# Patient Record
Sex: Male | Born: 1990 | Race: Black or African American | Hispanic: No | Marital: Single | Smoking: Never smoker
Health system: Southern US, Community
[De-identification: ages and names within clinical notes are randomized; demographics above are authoritative.]

## PROBLEM LIST (undated history)

## (undated) HISTORY — PX: BACK SURGERY: SHX140

---

## 2020-01-30 ENCOUNTER — Encounter (HOSPITAL_COMMUNITY): Payer: Self-pay | Admitting: Emergency Medicine

## 2020-01-30 ENCOUNTER — Emergency Department (HOSPITAL_COMMUNITY): Payer: HRSA Program

## 2020-01-30 ENCOUNTER — Other Ambulatory Visit: Payer: Self-pay

## 2020-01-30 ENCOUNTER — Emergency Department (HOSPITAL_COMMUNITY)
Admission: EM | Admit: 2020-01-30 | Discharge: 2020-01-30 | Disposition: A | Discharge status: Home / Self Care | Payer: HRSA Program | Attending: Emergency Medicine | Admitting: Emergency Medicine

## 2020-01-30 DIAGNOSIS — Z20822 Contact with and (suspected) exposure to covid-19: Secondary | ICD-10-CM

## 2020-01-30 DIAGNOSIS — U071 COVID-19: Secondary | ICD-10-CM | POA: Diagnosis not present

## 2020-01-30 DIAGNOSIS — R197 Diarrhea, unspecified: Secondary | ICD-10-CM | POA: Diagnosis present

## 2020-01-30 LAB — SARS CORONAVIRUS 2 BY RT PCR (HOSPITAL ORDER, PERFORMED IN ~~LOC~~ HOSPITAL LAB): SARS Coronavirus 2: POSITIVE — AB

## 2020-01-30 LAB — COMPREHENSIVE METABOLIC PANEL
ALT: 18 U/L (ref 0–44)
AST: 37 U/L (ref 15–41)
Albumin: 4.5 g/dL (ref 3.5–5.0)
Alkaline Phosphatase: 53 U/L (ref 38–126)
Anion gap: 12 (ref 5–15)
BUN: 10 mg/dL (ref 6–20)
CO2: 26 mmol/L (ref 22–32)
Calcium: 10.1 mg/dL (ref 8.9–10.3)
Chloride: 101 mmol/L (ref 98–111)
Creatinine, Ser: 0.96 mg/dL (ref 0.61–1.24)
GFR calc Af Amer: >60 mL/min (ref >60)
GFR calc non Af Amer: >60 mL/min (ref >60)
Glucose, Bld: 96 mg/dL (ref 70–99)
Potassium: 3.8 mmol/L (ref 3.5–5.1)
Sodium: 139 mmol/L (ref 135–145)
Total Bilirubin: 0.7 mg/dL (ref 0.3–1.2)
Total Protein: 7.8 g/dL (ref 6.5–8.1)

## 2020-01-30 LAB — CBC
HCT: 46.4 % (ref 39.0–52.0)
Hemoglobin: 15.5 g/dL (ref 13.0–17.0)
MCH: 26.9 pg (ref 26.0–34.0)
MCHC: 33.4 g/dL (ref 30.0–36.0)
MCV: 80.4 fL (ref 80.0–100.0)
Platelets: 144 10*3/uL — ABNORMAL LOW (ref 150–400)
RBC: 5.77 MIL/uL (ref 4.22–5.81)
RDW: 14.5 % (ref 11.5–15.5)
WBC: 3.7 10*3/uL — ABNORMAL LOW (ref 4.0–10.5)
nRBC: 0 % (ref 0.0–0.2)

## 2020-01-30 LAB — URINALYSIS, ROUTINE W REFLEX MICROSCOPIC
Bacteria, UA: NONE SEEN
Bilirubin Urine: NEGATIVE
Glucose, UA: NEGATIVE mg/dL
Hgb urine dipstick: NEGATIVE
Ketones, ur: 20 mg/dL — AB
Leukocytes,Ua: NEGATIVE
Nitrite: NEGATIVE
Protein, ur: 30 mg/dL — AB
Specific Gravity, Urine: 1.028 (ref 1.005–1.030)
pH: 7 (ref 5.0–8.0)

## 2020-01-30 LAB — LIPASE, BLOOD: Lipase: 22 U/L (ref 11–51)

## 2020-01-30 MED ORDER — ALBUTEROL SULFATE HFA 108 (90 BASE) MCG/ACT IN AERS
2.0000 | INHALATION_SPRAY | Freq: Once | RESPIRATORY_TRACT | Status: AC
  Administered 2020-01-30: 2 via RESPIRATORY_TRACT
  Filled 2020-01-30: qty 6.7

## 2020-01-30 NOTE — ED Provider Notes (Addendum)
[facility]-EMERGENCY DEPT Provider Note   CSN: [id number] Arrival date & time: 01/30/20  1603     History Chief Complaint  Patient presents with  . loss of taste and smell  . Diarrhea  . Nausea  . Emesis    Nathan Harmon is a 29 y.o. male.  HPI 29 year old male presents to the ER with complaints of loss of taste or smell, diarrhea, nausea, nonbloody nonbilious vomiting, myalgias, chills x3 days.  Patient is not vaccinated.  Reports a nonbloody productive cough.  Has been feeling chilled and weak, short of breath.  Has a history of asthma per the patient and he feels like this has been flared up.  He has been having nausea, vomiting, poor p.o. intake over the last few days.  Has not taken anything for his symptoms.    History reviewed. No pertinent past medical history.  There are no problems to display for this patient.   History reviewed. No pertinent surgical history.     No family history on file.  Social History   Tobacco Use  . Smoking status: Never Smoker  . Smokeless tobacco: Never Used  Substance Use Topics  . Alcohol use: Not on file  . Drug use: Not on file    Home Medications Prior to Admission medications   Not on File    Allergies    Patient has no allergy information on record.  Review of Systems   Review of Systems  Constitutional: Positive for activity change, appetite change, chills, fatigue and fever.  HENT: Negative for ear pain and sore throat.   Eyes: Negative for pain and visual disturbance.  Respiratory: Positive for cough and shortness of breath.   Cardiovascular: Negative for chest pain and palpitations.  Gastrointestinal: Positive for diarrhea, nausea and vomiting. Negative for abdominal pain.  Genitourinary: Negative for dysuria and hematuria.  Musculoskeletal: Negative for arthralgias and back pain.  Skin: Negative for color change and rash.  Neurological: Positive for weakness. Negative for seizures and  syncope.  All other systems reviewed and are negative.   Physical Exam Updated Vital Signs BP (!) 143/98 (BP Location: Left Arm)   Pulse 78   Temp 98.8 F (37.1 C) (Oral)   Resp 16   Ht 5\' 9"  (1.753 m)   Wt 79.8 kg   SpO2 100%   BMI 25.99 kg/m   Physical Exam Vitals and nursing note reviewed.  Constitutional:      General: He is not in acute distress.    Appearance: Normal appearance. He is well-developed. He is not ill-appearing, toxic-appearing or diaphoretic.  HENT:     Head: Normocephalic and atraumatic.  Eyes:     Conjunctiva/sclera: Conjunctivae normal.  Cardiovascular:     Rate and Rhythm: Normal rate and regular rhythm.     Heart sounds: No murmur heard.   Pulmonary:     Effort: Pulmonary effort is normal. No respiratory distress.     Breath sounds: Normal breath sounds.  Abdominal:     General: Abdomen is flat.     Palpations: Abdomen is soft.     Tenderness: There is no abdominal tenderness.  Musculoskeletal:        General: Normal range of motion.     Cervical back: Normal range of motion and neck supple.  Skin:    General: Skin is warm and dry.     Findings: No erythema.  Neurological:     General: No focal deficit present.     Mental  Status: He is alert and oriented to person, place, and time.     ED Results / Procedures / Treatments   Labs (all labs ordered are listed, but only abnormal results are displayed) Labs Reviewed  CBC - Abnormal; Notable for the following components:      Result Value   WBC 3.7 (*)    Platelets 144 (*)    All other components within normal limits  URINALYSIS, ROUTINE W REFLEX MICROSCOPIC - Abnormal; Notable for the following components:   Ketones, ur 20 (*)    Protein, ur 30 (*)    All other components within normal limits  SARS CORONAVIRUS 2 BY RT PCR (HOSPITAL ORDER, PERFORMED IN [facility])  LIPASE, BLOOD  COMPREHENSIVE METABOLIC PANEL    EKG None  Radiology DG Chest Portable 1  View  Result Date: 01/30/2020 CLINICAL DATA:  Cough diarrhea EXAM: PORTABLE CHEST 1 VIEW COMPARISON:  None. FINDINGS: The heart size and mediastinal contours are within normal limits. Both lungs are clear. Surgical rods in the lower thoracic spine. IMPRESSION: No active disease. Electronically Signed   By: Jasmine Pang M.D.   On: 01/30/2020 20:58    Procedures Procedures (including critical care time)  Medications Ordered in ED Medications  albuterol (VENTOLIN HFA) 108 (90 Base) MCG/ACT inhaler 2 puff (2 puffs Inhalation Given 01/30/20 2053)    ED Course  I have reviewed the triage vital signs and the nursing notes.  Pertinent labs & imaging results that were available during my care of the patient were reviewed by me and considered in my medical decision making (see chart for details).    MDM Rules/Calculators/A&P                          29 year old male with viral URI symptoms, suspect COVID.  Vitals reassuring, not hypoxic, tachypneic, speaking full sentences, overall well-appearing.  No evidence of respiratory distress.  Covid test is pending, CBC and CMP largely unremarkable.  Chest x-ray without evidence of pneumonia.  Abdomen soft and nontender, lipase normal.  No evidence of an acute abdomen.  Patient tolerated p.o. fluids well here in the ED.  Patient was requesting a refill of his albuterol, which I provided here in the ED today.  Will send home with Zofran, return precautions discussed.  Patient educated on buying a pulse oximeter and checking for hypoxia.  Was instructed to quarantine and check his Covid test results via MyChart.  He was instructed to quarantine an additional 7 to 10 days of his test results are positive.  Stable for discharge at this time with strict return precautions.  He voiced understanding and is agreeable.  Nathan Harmon was evaluated in Emergency Department on 01/30/2020 for the symptoms described in the history of present illness. He was evaluated in the  context of the global COVID-19 pandemic, which necessitated consideration that the patient might be at risk for infection with the SARS-CoV-2 virus that causes COVID-19. Institutional protocols and algorithms that pertain to the evaluation of patients at risk for COVID-19 are in a state of rapid change based on information released by regulatory bodies including the CDC and federal and state organizations. These policies and algorithms were followed during the patient's care in the ED.  Final Clinical Impression(s) / ED Diagnoses Final diagnoses:  Encounter for laboratory testing for COVID-19 virus    Rx / DC Orders ED Discharge Orders    None  Leone Brand 01/30/20 2111    Gerhard Munch, MD 02/02/20 2256

## 2020-01-30 NOTE — ED Notes (Signed)
Blue and gold top (2) sent to lab. 

## 2020-01-30 NOTE — ED Triage Notes (Signed)
Patient here from home reporting loss of taste and smell, diarrhea, n/v since Monday. Non vaccinated.

## 2020-01-30 NOTE — Discharge Instructions (Addendum)
You were tested for COVID-19 today, make sure to quarantine until you receive the results. If this is positive, please make sure to quarantine additional 7 to 10 days. You may take ibuprofen/Tylenol for body aches and fevers, drink plenty of fluids, over-the-counter cold and flu medications. You may also buy an over-the-counter pulse oximeter which can be found at your local pharmacy. If your oxygen saturation drops below 90%, please make sure to return to the ER. Return to the ER for any worsening shortness of breath.      Person Under Monitoring Name: Nathan Harmon  Location: [address] [place] [address]   Infection Prevention Recommendations for Individuals Confirmed to have, or Being Evaluated for, 2019 Novel Coronavirus (COVID-19) Infection Who Receive Care at Home  Individuals who are confirmed to have, or are being evaluated for, COVID-19 should follow the prevention steps below until a healthcare provider or local or state health department says they can return to normal activities.  Stay home except to get medical care You should restrict activities outside your home, except for getting medical care. Do not go to work, school, or public areas, and do not use public transportation or taxis.  Call ahead before visiting your doctor Before your medical appointment, call the healthcare provider and tell them that you have, or are being evaluated for, COVID-19 infection. This will help the healthcare provider's office take steps to keep other people from getting infected. Ask your healthcare provider to call the local or state health department.  Monitor your symptoms Seek prompt medical attention if your illness is worsening (e.g., difficulty breathing). Before going to your medical appointment, call the healthcare provider and tell them that you have, or are being evaluated for, COVID-19 infection. Ask your healthcare provider to call the local or state health  department.  Wear a facemask You should wear a facemask that covers your nose and mouth when you are in the same room with other people and when you visit a healthcare provider. People who live with or visit you should also wear a facemask while they are in the same room with you.  Separate yourself from other people in your home As much as possible, you should stay in a different room from other people in your home. Also, you should use a separate bathroom, if available.  Avoid sharing household items You should not share dishes, drinking glasses, cups, eating utensils, towels, bedding, or other items with other people in your home. After using these items, you should wash them thoroughly with soap and water.  Cover your coughs and sneezes Cover your mouth and nose with a tissue when you cough or sneeze, or you can cough or sneeze into your sleeve. Throw used tissues in a lined trash can, and immediately wash your hands with soap and water for at least 20 seconds or use an alcohol-based hand rub.  Wash your [employer] your hands often and thoroughly with soap and water for at least 20 seconds. You can use an alcohol-based hand sanitizer if soap and water are not available and if your hands are not visibly dirty. Avoid touching your eyes, nose, and mouth with unwashed hands.   Prevention Steps for Caregivers and Household Members of Individuals Confirmed to have, or Being Evaluated for, COVID-19 Infection Being Cared for in the Home  If you live with, or provide care at home for, a person confirmed to have, or being evaluated for, COVID-19 infection please follow these guidelines  to prevent infection:  Follow healthcare provider's instructions Make sure that you understand and can help the patient follow any healthcare provider instructions for all care.  Provide for the patient's basic needs You should help the patient with basic needs in the home and provide support for getting  groceries, prescriptions, and other personal needs.  Monitor the patient's symptoms If they are getting sicker, call his or her medical provider and tell them that the patient has, or is being evaluated for, COVID-19 infection. This will help the healthcare provider's office take steps to keep other people from getting infected. Ask the healthcare provider to call the local or state health department.  Limit the number of people who have contact with the patient If possible, have only one caregiver for the patient. Other household members should stay in another home or place of residence. If this is not possible, they should stay in another room, or be separated from the patient as much as possible. Use a separate bathroom, if available. Restrict visitors who do not have an essential need to be in the home.  Keep older adults, very young children, and other sick people away from the patient Keep older adults, very young children, and those who have compromised immune systems or chronic health conditions away from the patient. This includes people with chronic heart, lung, or kidney conditions, diabetes, and cancer.  Ensure good ventilation Make sure that shared spaces in the home have good air flow, such as from an air conditioner or an opened window, weather permitting.  Wash your hands often Wash your hands often and thoroughly with soap and water for at least 20 seconds. You can use an alcohol based hand sanitizer if soap and water are not available and if your hands are not visibly dirty. Avoid touching your eyes, nose, and mouth with unwashed hands. Use disposable paper towels to dry your hands. If not available, use dedicated cloth towels and replace them when they become wet.  Wear a facemask and gloves Wear a disposable facemask at all times in the room and gloves when you touch or have contact with the patient's blood, body fluids, and/or secretions or excretions, such as sweat,  saliva, sputum, nasal mucus, vomit, urine, or feces.  Ensure the mask fits over your nose and mouth tightly, and do not touch it during use. Throw out disposable facemasks and gloves after using them. Do not reuse. Wash your hands immediately after removing your facemask and gloves. If your personal clothing becomes contaminated, carefully remove clothing and launder. Wash your hands after handling contaminated clothing. Place all used disposable facemasks, gloves, and other waste in a lined container before disposing them with other household waste. Remove gloves and wash your hands immediately after handling these items.  Do not share dishes, glasses, or other household items with the patient Avoid sharing household items. You should not share dishes, drinking glasses, cups, eating utensils, towels, bedding, or other items with a patient who is confirmed to have, or being evaluated for, COVID-19 infection. After the person uses these items, you should wash them thoroughly with soap and water.  Wash laundry thoroughly Immediately remove and wash clothes or bedding that have blood, body fluids, and/or secretions or excretions, such as sweat, saliva, sputum, nasal mucus, vomit, urine, or feces, on them. Wear gloves when handling laundry from the patient. Read and follow directions on labels of laundry or clothing items and detergent. In general, wash and dry with the warmest temperatures recommended  on the label.  Clean all areas the individual has used often Clean all touchable surfaces, such as counters, tabletops, doorknobs, bathroom fixtures, toilets, phones, keyboards, tablets, and bedside tables, every day. Also, clean any surfaces that may have blood, body fluids, and/or secretions or excretions on them. Wear gloves when cleaning surfaces the patient has come in contact with. Use a diluted bleach solution (e.g., dilute bleach with 1 part bleach and 10 parts water) or a household disinfectant  with a label that says EPA-registered for coronaviruses. To make a bleach solution at home, add 1 tablespoon of bleach to 1 quart (4 cups) of water. For a larger supply, add  cup of bleach to 1 gallon (16 cups) of water. Read labels of cleaning products and follow recommendations provided on product labels. Labels contain instructions for safe and effective use of the cleaning product including precautions you should take when applying the product, such as wearing gloves or eye protection and making sure you have good ventilation during use of the product. Remove gloves and wash hands immediately after cleaning.  Monitor yourself for signs and symptoms of illness Caregivers and household members are considered close contacts, should monitor their health, and will be asked to limit movement outside of the home to the extent possible. Follow the monitoring steps for close contacts listed on the symptom monitoring form.   ? If you have additional questions, contact your local health department or call the epidemiologist on call at [phone] (available 24/7). ? This guidance is subject to change. For the most up-to-date guidance from [facility], please refer to their website: [employer]

## 2020-02-01 ENCOUNTER — Encounter: Payer: Self-pay | Admitting: Nurse Practitioner

## 2020-02-01 ENCOUNTER — Telehealth: Payer: Self-pay | Admitting: Nurse Practitioner

## 2020-02-01 DIAGNOSIS — U071 COVID-19: Secondary | ICD-10-CM

## 2020-02-01 NOTE — Telephone Encounter (Signed)
Called to Discuss with patient about Covid symptoms and the use of regeneron, a monoclonal antibody infusion for those with mild to moderate Covid symptoms and at a high risk of hospitalization.     Pt is qualified for this infusion at the [facility] due to co-morbid conditions and/or a member of an at-risk group.     Unable to reach pt. Left message to return call. Sent mychart message.   Prudy Candy, DNP, AGNP-C [phone] (Infusion Center Hotline)  

## 2021-02-17 IMAGING — DX DG CHEST 1V PORT
1 series · 1 of 1 positions shown · non-contrast
Comparison: None.

CLINICAL DATA: Cough diarrhea

EXAM:
PORTABLE CHEST 1 VIEW

[chest ap]
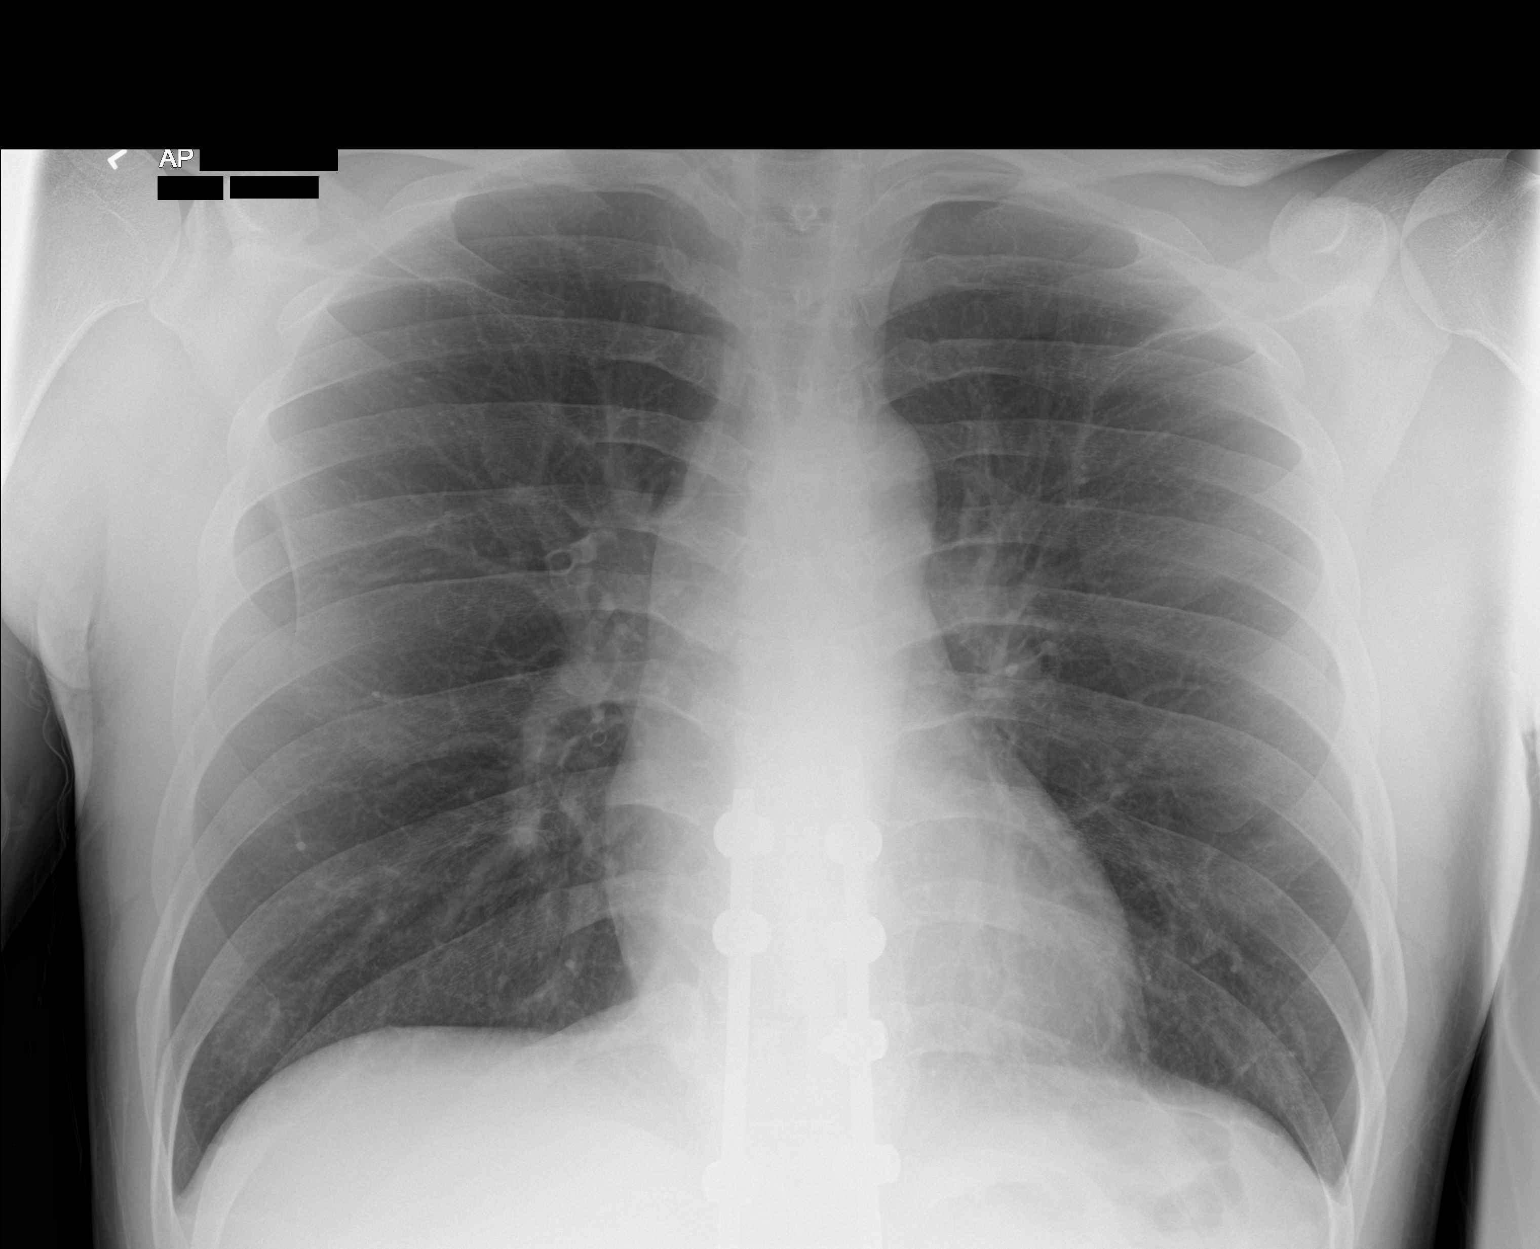

[1 of 1 positions shown; findings below may reference images not displayed]

FINDINGS: The heart size and mediastinal contours are within normal limits.
Both lungs are clear. Surgical rods in the lower thoracic spine.
IMPRESSION: No active disease.

## 2021-06-18 ENCOUNTER — Other Ambulatory Visit: Payer: Self-pay

## 2021-06-18 ENCOUNTER — Emergency Department (HOSPITAL_COMMUNITY)
Admission: EM | Admit: 2021-06-18 | Discharge: 2021-06-18 | Disposition: A | Discharge status: Home / Self Care | Payer: Self-pay | Attending: Emergency Medicine | Admitting: Emergency Medicine

## 2021-06-18 ENCOUNTER — Encounter (HOSPITAL_COMMUNITY): Payer: Self-pay

## 2021-06-18 DIAGNOSIS — S61512A Laceration without foreign body of left wrist, initial encounter: Secondary | ICD-10-CM | POA: Insufficient documentation

## 2021-06-18 DIAGNOSIS — Z23 Encounter for immunization: Secondary | ICD-10-CM | POA: Insufficient documentation

## 2021-06-18 DIAGNOSIS — W25XXXA Contact with sharp glass, initial encounter: Secondary | ICD-10-CM | POA: Insufficient documentation

## 2021-06-18 MED ORDER — BACITRACIN ZINC 500 UNIT/GM EX OINT
TOPICAL_OINTMENT | Freq: Two times a day (BID) | CUTANEOUS | Status: DC
  Filled 2021-06-18: qty 1.8
  Filled 2021-06-18: qty 0.9

## 2021-06-18 MED ORDER — TETANUS-DIPHTH-ACELL PERTUSSIS 5-2.5-18.5 LF-MCG/0.5 IM SUSY
0.5000 mL | PREFILLED_SYRINGE | Freq: Once | INTRAMUSCULAR | Status: AC
  Administered 2021-06-18: 0.5 mL via INTRAMUSCULAR
  Filled 2021-06-18: qty 0.5

## 2021-06-18 MED ORDER — CEPHALEXIN 500 MG PO CAPS: 500.0000 mg | ORAL_CAPSULE | Freq: Two times a day (BID) | ORAL | 0 refills | Status: AC

## 2021-06-18 MED ORDER — LIDOCAINE-EPINEPHRINE (PF) 2 %-1:200000 IJ SOLN
10.0000 mL | Freq: Once | INTRAMUSCULAR | Status: AC
  Administered 2021-06-18: 10 mL via INTRADERMAL
  Filled 2021-06-18: qty 20
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
## 2021-06-18 NOTE — Discharge Instructions (Addendum)
1. Medications: Tylenol or ibuprofen for pain, usual home medications 2. Treatment: ice for swelling, keep wound clean with warm soap and water and keep bandage dry, do not submerge in water for 24 hours 3. Follow Up: Please return or follow up with primary care provider in 7-10 days to have your stitches/staples removed or sooner if you have concerns. Return to the emergency department for increased redness, drainage of pus from the wound, or fevers/chills. Once you have your stitches removed, you can use a cream called Mederma to help with scarring.   WOUND CARE  Keep area clean and dry for 24 hours. Do not remove bandage, if applied.  After 24 hours, remove bandage and wash wound gently with mild soap and warm water. Reapply a new bandage after cleaning wound, if directed.   Continue daily cleansing with soap and water until stitches/staples are removed.  Do not apply any ointments or creams to the wound while stitches/staples are in place, as this may cause delayed healing. Return if you experience any of the following signs of infection: Swelling, redness, pus drainage, streaking, fever >101.0 F  Return if you experience excessive bleeding that does not stop after 15-20 minutes of constant, firm pressure.
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
## 2021-06-26 ENCOUNTER — Emergency Department (HOSPITAL_COMMUNITY)
Admission: EM | Admit: 2021-06-26 | Discharge: 2021-06-26 | Disposition: A | Discharge status: Home / Self Care | Payer: Self-pay | Attending: Emergency Medicine | Admitting: Emergency Medicine

## 2021-06-26 ENCOUNTER — Other Ambulatory Visit: Payer: Self-pay

## 2021-06-26 ENCOUNTER — Encounter (HOSPITAL_COMMUNITY): Payer: Self-pay

## 2021-06-26 DIAGNOSIS — Z4802 Encounter for removal of sutures: Secondary | ICD-10-CM | POA: Insufficient documentation

## 2021-06-26 DIAGNOSIS — X58XXXD Exposure to other specified factors, subsequent encounter: Secondary | ICD-10-CM | POA: Insufficient documentation

## 2021-06-26 DIAGNOSIS — S61502D Unspecified open wound of left wrist, subsequent encounter: Secondary | ICD-10-CM | POA: Insufficient documentation

## 2021-06-26 NOTE — Discharge Instructions (Signed)
It was a pleasure taking care of you today. Keep area clean.

## 2021-06-26 NOTE — ED Triage Notes (Signed)
Pt is coming to have sutures removed from last Monday to his left wrist.

## 2021-06-26 NOTE — ED Provider Notes (Signed)
[facility]   CSN: [id number] Arrival date & time: 06/26/21  1801     History  Chief Complaint  Patient presents with   Suture / Staple Removal    Nathan Harmon is a 31 y.o. male who presents to the ED for suture removal.  Patient had 6 stitches placed on 1/30 to his left wrist.  Denies purulent drainage.  No fever or chills.  He notes he has been cleaning the wound with soap and water.     Home Medications Prior to Admission medications   Not on File      Allergies    Patient has no allergy information on record.    Review of Systems   Review of Systems  Constitutional:  Negative for fever.  Skin:  Positive for wound.   Physical Exam Updated Vital Signs BP (!) 154/101 (BP Location: Right Arm)    Pulse 73    Temp 98.4 F (36.9 C) (Oral)    Resp 18    Ht 5\' 9"  (1.753 m)    Wt 81.6 kg    SpO2 100%    BMI 26.58 kg/m  Physical Exam Vitals and nursing note reviewed.  Constitutional:      General: He is not in acute distress.    Appearance: He is not ill-appearing.  HENT:     Head: Normocephalic.  Eyes:     Pupils: Pupils are equal, round, and reactive to light.  Cardiovascular:     Rate and Rhythm: Normal rate and regular rhythm.     Pulses: Normal pulses.     Heart sounds: Normal heart sounds. No murmur heard.   No friction rub. No gallop.  Pulmonary:     Effort: Pulmonary effort is normal.     Breath sounds: Normal breath sounds.  Abdominal:     General: Abdomen is flat. There is no distension.     Palpations: Abdomen is soft.     Tenderness: There is no abdominal tenderness. There is no guarding or rebound.  Musculoskeletal:        General: Normal range of motion.     Cervical back: Neck supple.  Skin:    General: Skin is warm and dry.     Comments: Well healing wound to left wrist. No purulent drainage  Neurological:     General: No focal deficit present.     Mental Status: He is alert.  Psychiatric:         Mood and Affect: Mood normal.        Behavior: Behavior normal.    ED Results / Procedures / Treatments   Labs (all labs ordered are listed, but only abnormal results are displayed) Labs Reviewed - No data to display  EKG None  Radiology No results found.  Procedures .Suture Removal  Date/Time: 06/26/2021 8:38 PM Performed by: 08/24/2021, PA-C Authorized by: Mannie Stabile, PA-C   Consent:    Consent obtained:  Verbal   Consent given by:  Patient   Risks discussed:  Bleeding, pain and wound separation   Alternatives discussed:  No treatment Universal protocol:    Procedure explained and questions answered to patient or proxy's satisfaction: yes     Relevant documents present and verified: yes     Test results available: yes     Imaging studies available: yes     Required blood products, implants, devices, and special equipment available: yes     Site/side marked: yes  Immediately prior to procedure, a time out was called: yes     Patient identity confirmed:  Verbally with patient Location:    Location:  Upper extremity   Upper extremity location:  Arm   Arm location:  L lower arm Procedure details:    Wound appearance:  No signs of infection   Number of sutures removed:  6 Post-procedure details:    Post-removal:  No dressing applied   Procedure completion:  Tolerated well, no immediate complications    Medications Ordered in ED Medications - No data to display  ED Course/ Medical Decision Making/ A&P                           Medical Decision Making Risk OTC drugs.   31 year old male presents the ED for suture removal.  Patient had 6 sutures placed on 1/30 to left wrist.  No fever or chills.  No purulent drainage.  Well healing wound to left wrist.  Suture removal as noted above.  No signs of infection. Strict ED precautions discussed with patient. Patient states understanding and agrees to plan. Patient discharged home in no acute  distress and stable vitals        Final Clinical Impression(s) / ED Diagnoses Final diagnoses:  Visit for suture removal    Rx / DC Orders ED Discharge Orders     None         Jesusita Oka 06/26/21 2041    Linwood Dibbles, MD 06/27/21 0002

## 2021-10-07 ENCOUNTER — Emergency Department (HOSPITAL_COMMUNITY): Payer: Self-pay

## 2021-10-07 ENCOUNTER — Other Ambulatory Visit: Payer: Self-pay

## 2021-10-07 ENCOUNTER — Emergency Department (HOSPITAL_COMMUNITY)
Admission: EM | Admit: 2021-10-07 | Discharge: 2021-10-07 | Disposition: A | Discharge status: Home / Self Care | Payer: Self-pay | Attending: Emergency Medicine | Admitting: Emergency Medicine

## 2021-10-07 ENCOUNTER — Encounter (HOSPITAL_COMMUNITY): Payer: Self-pay

## 2021-10-07 DIAGNOSIS — R001 Bradycardia, unspecified: Secondary | ICD-10-CM | POA: Insufficient documentation

## 2021-10-07 DIAGNOSIS — R0789 Other chest pain: Secondary | ICD-10-CM | POA: Insufficient documentation

## 2021-10-07 DIAGNOSIS — R42 Dizziness and giddiness: Secondary | ICD-10-CM | POA: Insufficient documentation

## 2021-10-07 DIAGNOSIS — R0981 Nasal congestion: Secondary | ICD-10-CM | POA: Insufficient documentation

## 2021-10-07 DIAGNOSIS — R0989 Other specified symptoms and signs involving the circulatory and respiratory systems: Secondary | ICD-10-CM

## 2021-10-07 LAB — CBC
HCT: 43.7 % (ref 39.0–52.0)
Hemoglobin: 14.4 g/dL (ref 13.0–17.0)
MCH: 27 pg (ref 26.0–34.0)
MCHC: 33 g/dL (ref 30.0–36.0)
MCV: 81.8 fL (ref 80.0–100.0)
Platelets: 165 10*3/uL (ref 150–400)
RBC: 5.34 MIL/uL (ref 4.22–5.81)
RDW: 15.8 % — ABNORMAL HIGH (ref 11.5–15.5)
WBC: 4.4 10*3/uL (ref 4.0–10.5)
nRBC: 0 % (ref 0.0–0.2)

## 2021-10-07 LAB — BASIC METABOLIC PANEL
Anion gap: 6 (ref 5–15)
BUN: 11 mg/dL (ref 6–20)
CO2: 27 mmol/L (ref 22–32)
Calcium: 9.9 mg/dL (ref 8.9–10.3)
Chloride: 106 mmol/L (ref 98–111)
Creatinine, Ser: 0.93 mg/dL (ref 0.61–1.24)
GFR, Estimated: >60 mL/min (ref >60)
Glucose, Bld: 82 mg/dL (ref 70–99)
Potassium: 4.1 mmol/L (ref 3.5–5.1)
Sodium: 139 mmol/L (ref 135–145)

## 2021-10-07 LAB — TROPONIN I (HIGH SENSITIVITY): Troponin I (High Sensitivity): 3 ng/L (ref <18)

## 2021-10-07 NOTE — Discharge Instructions (Signed)
Please find a primary care provider for further evaluation and management of your condition.  Your symptoms may be due to irritation from the fumes at your work environment.  Please avoid direct inhalation of smokes/fumes when possible.

## 2021-10-07 NOTE — ED Provider Notes (Signed)
[facility]PT Provider Note   CSN: [id number] Arrival date & time: 10/07/21  1007     History  Chief Complaint  Patient presents with   Sore Throat    Nathan Harmon is a 31 y.o. male.  The history is provided by the patient. No language interpreter was used.  Sore Throat   31 year old male presenting complaining of throat irritation.  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  His symptoms  starts after he work for period of time and persist throughout the day.  States he would experience nasal congestion having to blow his nose frequently, throat irritation, and chest congestion.  He feels more tired than usual.  He does not endorse any fever, chills, body aches, itchy eyes, excessive sneezing, abdominal pain or rash.  He does use marijuana.  He denies heavy alcohol use.  He denies any specific treatment tried.  No recent sick contact.  Home Medications Prior to Admission medications   Not on File      Allergies    Patient has no known allergies.    Review of Systems   Review of Systems  All other systems reviewed and are negative.  Physical Exam Updated Vital Signs BP (!) 121/92 (BP Location: Right Arm)   Pulse (!) 49   Temp 98.2 F (36.8 C) (Oral)   Resp 14   Ht 5\' 10"  (1.778 m)   Wt 83.9 kg   SpO2 99%   BMI 26.54 kg/m  Physical Exam Vitals and nursing note reviewed.  Constitutional:      General: He is not in acute distress.    Appearance: He is well-developed.  HENT:     Head: Atraumatic.     Nose: Nose normal.     Mouth/Throat:     Mouth: Mucous membranes are moist.     Pharynx: No oropharyngeal exudate or posterior oropharyngeal erythema.  Eyes:     Conjunctiva/sclera: Conjunctivae normal.  Neck:     Comments: No thyromegaly Cardiovascular:     Rate and Rhythm: Bradycardia present.      Pulses: Normal pulses.     Heart sounds: Normal heart sounds.  Pulmonary:     Effort: Pulmonary effort is normal.     Breath sounds: Normal breath sounds. No wheezing, rhonchi or rales.  Abdominal:     Palpations: Abdomen is soft.     Tenderness: There is no abdominal tenderness.  Musculoskeletal:     Cervical back: Normal range of motion and neck supple.  Skin:    Findings: No rash.  Neurological:     Mental Status: He is alert and oriented to person, place, and time.  Psychiatric:        Mood and Affect: Mood normal.    ED Results / Procedures / Treatments   Labs (all labs ordered are listed, but only abnormal results are displayed) Labs Reviewed  CBC - Abnormal; Notable for the following components:      Result Value   RDW 15.8 (*)    All other components within normal limits  BASIC METABOLIC PANEL  TROPONIN I (HIGH SENSITIVITY)  TROPONIN I (HIGH SENSITIVITY)    EKG EKG Interpretation  Date/Time:  Sunday Oct 07 2021 10:22:31 EDT Ventricular Rate:  55 PR Interval:  172 QRS Duration: 86 QT Interval:  401 QTC Calculation: 384 R Axis:   95 Text  Interpretation: Sinus rhythm Borderline right axis deviation ST elev, probable normal early repol pattern Confirmed by Marianna Fuss ([id number]) on 10/07/2021 10:44:18 AM  Radiology DG Chest 2 View  Result Date: 10/07/2021 CLINICAL DATA:  31 year old male with cough, chest pain, sore throat. EXAM: CHEST - 2 VIEW COMPARISON:  Portable chest 01/30/2020. FINDINGS: Chronic posterior thoracolumbar spinal fusion hardware. No acute osseous abnormality identified. Normal lung volumes and mediastinal contours. Visualized tracheal air column is within normal limits. Both lungs appear clear. No pneumothorax or pleural effusion. Negative visible bowel gas. IMPRESSION: No cardiopulmonary abnormality.  Previous thoracolumbar fusion. Electronically Signed   By: Odessa Fleming M.D.   On: 10/07/2021 12:19    Procedures Procedures    Medications  Ordered in ED Medications - No data to display  ED Course/ Medical Decision Making/ A&P                           Medical Decision Making Amount and/or Complexity of Data Reviewed Labs: ordered. Radiology: ordered.   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  He endorsed occasional pain in his chest but denies any lightheadedness or dizziness.  He does not endorse any fever chills body aches to suggest infectious etiology.  No significant itchy eyes or sneezing to suggest seasonal allergy.  Patient overall well-appearing vital signs noted to have bradycardia with heart rates fluctuate between 40s to 50s.  Heart and lung sounds normal, throat exam unremarkable, patient speaking in complete sentences and resting comfortably.  Labs obtained and independently reviewed interpreted by me and are reassuring.  EKG obtained showing sinus bradycardia without concerning heart block.    At this time, no acute concerning pathology noted warranting hospital admission.  I felt patient is stable to be discharged home to follow-up closely with PCP for outpatient evaluation.  I will also provide work note.  Encourage patient to avoid environment that can potentially triggers his shortness of breath such as tight enclose place with presence of smoke.  This patient presents to the ED for concern of cough, this involves an extensive number of treatment options, and is a complaint that carries with it a high risk of complications and morbidity.  The differential diagnosis includes reactive airway disease due to fume  inhalation, pna, ptx, copd, asthma  Co morbidities that complicate the patient evaluation  Additional history obtained:  Additional history obtained from patient External records from outside source obtained and reviewed including none  Lab Tests:  I Ordered, and personally interpreted labs.  The pertinent results include:  as above  Imaging Studies ordered:  I ordered imaging studies including CXR I independently visualized and interpreted imaging which showed no acute finding I agree with the radiologist interpretation  Cardiac Monitoring:  The patient was maintained on a cardiac monitor.  I personally viewed and interpreted the cardiac monitored which showed an underlying rhythm of: sinus bradycardia  Medicines ordered and prescription drug management:  Dispostion:  After consideration of the diagnostic results and the patients response to treatment, I feel that the patent would benefit from outpt f/u with PCP.          Final Clinical Impression(s) / ED Diagnoses Final diagnoses:  Chest congestion    Rx / DC Orders ED Discharge Orders     None         Fayrene Helperran, Edahi Kroening, PA-C 10/07/21 1401    Milagros Lollykstra, Richard S, MD 10/07/21 [id number]

## 2021-10-07 NOTE — ED Triage Notes (Signed)
Patient rports that he feels like he has swelling his throat x 2 weeks. Patient is talking in complete sentences and Sats 100% on room air, When asked about his c/o chest pain, patient states, "I work at [employer] and it is humid and hot in there and it causes me to not be able to take a full breath. Patient denies a cough.

## 2021-10-16 ENCOUNTER — Encounter (HOSPITAL_COMMUNITY): Payer: Self-pay

## 2021-10-16 ENCOUNTER — Emergency Department (HOSPITAL_COMMUNITY): Payer: Self-pay

## 2021-10-16 ENCOUNTER — Emergency Department (HOSPITAL_COMMUNITY)
Admission: EM | Admit: 2021-10-16 | Discharge: 2021-10-16 | Disposition: A | Discharge status: Home / Self Care | Payer: Self-pay | Attending: Emergency Medicine | Admitting: Emergency Medicine

## 2021-10-16 ENCOUNTER — Other Ambulatory Visit: Payer: Self-pay

## 2021-10-16 DIAGNOSIS — J40 Bronchitis, not specified as acute or chronic: Secondary | ICD-10-CM | POA: Insufficient documentation

## 2021-10-16 MED ORDER — AZITHROMYCIN 250 MG PO TABS: 250.0000 mg | ORAL_TABLET | Freq: Every day | ORAL | 0 refills | Status: DC

## 2021-10-16 MED ORDER — DEXAMETHASONE 4 MG PO TABS
10.0000 mg | ORAL_TABLET | Freq: Once | ORAL | Status: AC
  Administered 2021-10-16: 10 mg via ORAL
  Filled 2021-10-16: qty 1

## 2021-10-16 NOTE — Discharge Instructions (Addendum)
Chest x-ray showed no evidence of pneumonia.  Overall suspect that you have inflammatory process called bronchitis.  You have been treated with a dose of a long-acting steroid.  I have also prescribed antibiotics to help with this as well.

## 2021-10-16 NOTE — ED Triage Notes (Signed)
Patient c/o SOB x 1 week. Patient was seen on 10/07/21 for sore throat and chest congestion.

## 2021-10-16 NOTE — ED Notes (Signed)
X-ray at bedside

## 2021-10-16 NOTE — ED Notes (Signed)
Pt d/c home per MD order. Discharge summary reviewed with pt, pt verbalizes understanding. Ambulatory off unit. No s/s of acute distress noted at discharge.  °

## 2021-10-16 NOTE — ED Provider Notes (Incomplete)
  [facility]PT Provider Note   CSN: [id number] Arrival date & time: 10/16/21  1321     History {Add pertinent medical, surgical, social history, OB history to HPI:1} Chief Complaint  Patient presents with   Shortness of Breath    Nathan Harmon is a 31 y.o. male.   Shortness of Breath     Home Medications Prior to Admission medications   Not on File      Allergies    Patient has no known allergies.    Review of Systems   Review of Systems  Respiratory:  Positive for shortness of breath.    Physical Exam Updated Vital Signs BP (!) 140/94 (BP Location: Right Arm)   Pulse (!) 50   Temp 98.1 F (36.7 C) (Oral)   Resp 16   Ht 5\' 10"  (1.778 m)   Wt 83 kg   SpO2 100%   BMI 26.26 kg/m  Physical Exam  ED Results / Procedures / Treatments   Labs (all labs ordered are listed, but only abnormal results are displayed) Labs Reviewed - No data to display  EKG EKG Interpretation  Date/Time:  Tuesday Oct 16 2021 13:28:43 EDT Ventricular Rate:  50 PR Interval:  155 QRS Duration: 87 QT Interval:  414 QTC Calculation: 378 R Axis:   88 Text Interpretation: Sinus rhythm Confirmed by 05-23-2001, Nayara Taplin ([id number]) on 10/16/2021 1:33:24 PM  Radiology No results found.  Procedures Procedures  {Document cardiac monitor, telemetry assessment procedure when appropriate:1}  Medications Ordered in ED Medications - No data to display  ED Course/ Medical Decision Making/ A&P                           Medical Decision Making Amount and/or Complexity of Data Reviewed Radiology: ordered.   ***  {Document critical care time when appropriate:1} {Document review of labs and clinical decision tools ie heart score, Chads2Vasc2 etc:1}  {Document your independent review of radiology images, and any outside records:1} {Document your discussion with family members, caretakers, and with consultants:1} {Document social determinants of health affecting  pt's care:1} {Document your decision making why or why not admission, treatments were needed:1} Final Clinical Impression(s) / ED Diagnoses Final diagnoses:  None    Rx / DC Orders ED Discharge Orders     None

## 2021-10-16 NOTE — ED Provider Notes (Signed)
[facility]PT Provider Note   CSN: [id number] Arrival date & time: 10/16/21  1321     History {Add pertinent medical, surgical, social history, OB history to HPI:1} Chief Complaint  Patient presents with   Shortness of Breath    Nathan Harmon is a 31 y.o. male.  The history is provided by the patient.  Shortness of Breath Severity:  Mild Onset quality:  Gradual Duration:  5 days Timing:  Intermittent Progression:  Waxing and waning Chronicity:  New Context: URI   Relieved by:  Nothing Worsened by:  Nothing Associated symptoms: cough   Associated symptoms: no abdominal pain, no chest pain, no claudication, no diaphoresis, no ear pain, no fever, no headaches, no hemoptysis, no neck pain, no PND, no rash, no sore throat, no sputum production, no syncope, no swollen glands, no vomiting and no wheezing   Risk factors: no hx of PE/DVT and no recent surgery       Home Medications Prior to Admission medications   Not on File      Allergies    Patient has no known allergies.    Review of Systems   Review of Systems  Constitutional:  Negative for diaphoresis and fever.  HENT:  Negative for ear pain and sore throat.   Respiratory:  Positive for cough and shortness of breath. Negative for hemoptysis, sputum production and wheezing.   Cardiovascular:  Negative for chest pain, claudication, syncope and PND.  Gastrointestinal:  Negative for abdominal pain and vomiting.  Musculoskeletal:  Negative for neck pain.  Skin:  Negative for rash.  Neurological:  Negative for headaches.   Physical Exam Updated Vital Signs BP (!) 140/94 (BP Location: Right Arm)   Pulse (!) 50   Temp 98.1 F (36.7 C) (Oral)   Resp 16   Ht 5\' 10"  (1.778 m)   Wt 83 kg   SpO2 100%   BMI 26.26 kg/m  Physical Exam Vitals and nursing note reviewed.  Constitutional:      General: He is not in acute distress.    Appearance: He is well-developed.  HENT:     Head:  Normocephalic and atraumatic.     Mouth/Throat:     Mouth: Mucous membranes are moist.  Eyes:     Conjunctiva/sclera: Conjunctivae normal.     Pupils: Pupils are equal, round, and reactive to light.  Cardiovascular:     Rate and Rhythm: Normal rate and regular rhythm.     Pulses: Normal pulses.     Heart sounds: Normal heart sounds. No murmur heard. Pulmonary:     Effort: Pulmonary effort is normal. No respiratory distress.     Breath sounds: Normal breath sounds. No decreased breath sounds or wheezing.  Abdominal:     Palpations: Abdomen is soft.     Tenderness: There is no abdominal tenderness.  Musculoskeletal:        General: No swelling.     Cervical back: Normal range of motion and neck supple.     Right lower leg: No edema.     Left lower leg: No edema.  Skin:    General: Skin is warm and dry.     Capillary Refill: Capillary refill takes less than 2 seconds.  Neurological:     General: No focal deficit present.     Mental Status: He is alert.  Psychiatric:        Mood and Affect: Mood normal.    ED Results / Procedures / Treatments  Labs (all labs ordered are listed, but only abnormal results are displayed) Labs Reviewed - No data to display  EKG EKG Interpretation  Date/Time:  Tuesday Oct 16 2021 13:28:43 EDT Ventricular Rate:  50 PR Interval:  155 QRS Duration: 87 QT Interval:  414 QTC Calculation: 378 R Axis:   88 Text Interpretation: Sinus rhythm Confirmed by Virgina Norfolk ([id number]) on 10/16/2021 1:33:24 PM  Radiology No results found.  Procedures Procedures  {Document cardiac monitor, telemetry assessment procedure when appropriate:1}  Medications Ordered in ED Medications - No data to display  ED Course/ Medical Decision Making/ A&P                           Medical Decision Making Amount and/or Complexity of Data Reviewed Radiology: ordered.   Nathan Harmon is here with shortness of breath, cough for the last few days.  Seen here recently  for the same.  States for the most part he feels okay but every once in a while he will have a bad cough and feel little short of breath.  No recent travel or history of blood clot or recent surgery.  Patient is PERC negative and doubt PE.  EKG shows sinus rhythm.  No ischemic changes.  I have no concern for acute coronary syndrome.  Overall he has clear breath sounds on exam.  We will get a chest x-ray to evaluate for pneumonia, pneumothorax.  Could be a bronchitis as well.  He does smoke marijuana but does not vape or use cigarettes.  ***  This chart was dictated using voice recognition software.  Despite best efforts to proofread,  errors can occur which can change the documentation meaning.   {Document critical care time when appropriate:1} {Document review of labs and clinical decision tools ie heart score, Chads2Vasc2 etc:1}  {Document your independent review of radiology images, and any outside records:1} {Document your discussion with family members, caretakers, and with consultants:1} {Document social determinants of health affecting pt's care:1} {Document your decision making why or why not admission, treatments were needed:1} Final Clinical Impression(s) / ED Diagnoses Final diagnoses:  None    Rx / DC Orders ED Discharge Orders     None

## 2021-11-21 ENCOUNTER — Encounter (HOSPITAL_COMMUNITY): Payer: Self-pay

## 2021-11-21 ENCOUNTER — Emergency Department (HOSPITAL_COMMUNITY): Payer: Self-pay

## 2021-11-21 ENCOUNTER — Inpatient Hospital Stay (HOSPITAL_COMMUNITY)
Admission: EM | Admit: 2021-11-21 | Discharge: 2021-11-24 | DRG: 125 | Disposition: A | Discharge status: Home / Self Care | Payer: Self-pay | Attending: General Surgery | Admitting: General Surgery

## 2021-11-21 ENCOUNTER — Other Ambulatory Visit: Payer: Self-pay

## 2021-11-21 DIAGNOSIS — Z23 Encounter for immunization: Secondary | ICD-10-CM

## 2021-11-21 DIAGNOSIS — S060XAA Concussion with loss of consciousness status unknown, initial encounter: Secondary | ICD-10-CM

## 2021-11-21 DIAGNOSIS — S0232XA Fracture of orbital floor, left side, initial encounter for closed fracture: Principal | ICD-10-CM | POA: Diagnosis present

## 2021-11-21 DIAGNOSIS — T07XXXA Unspecified multiple injuries, initial encounter: Secondary | ICD-10-CM

## 2021-11-21 DIAGNOSIS — Z20822 Contact with and (suspected) exposure to covid-19: Secondary | ICD-10-CM | POA: Diagnosis present

## 2021-11-21 DIAGNOSIS — S060X0A Concussion without loss of consciousness, initial encounter: Secondary | ICD-10-CM | POA: Diagnosis present

## 2021-11-21 DIAGNOSIS — H532 Diplopia: Secondary | ICD-10-CM | POA: Diagnosis not present

## 2021-11-21 DIAGNOSIS — S0993XA Unspecified injury of face, initial encounter: Secondary | ICD-10-CM

## 2021-11-21 DIAGNOSIS — S0292XA Unspecified fracture of facial bones, initial encounter for closed fracture: Secondary | ICD-10-CM | POA: Diagnosis present

## 2021-11-21 DIAGNOSIS — H1132 Conjunctival hemorrhage, left eye: Secondary | ICD-10-CM | POA: Diagnosis present

## 2021-11-21 DIAGNOSIS — S0285XA Fracture of orbit, unspecified, initial encounter for closed fracture: Secondary | ICD-10-CM

## 2021-11-21 DIAGNOSIS — R600 Localized edema: Secondary | ICD-10-CM | POA: Diagnosis present

## 2021-11-21 DIAGNOSIS — Y904 Blood alcohol level of 80-99 mg/100 ml: Secondary | ICD-10-CM | POA: Diagnosis present

## 2021-11-21 DIAGNOSIS — N179 Acute kidney failure, unspecified: Secondary | ICD-10-CM | POA: Diagnosis present

## 2021-11-21 DIAGNOSIS — S0990XA Unspecified injury of head, initial encounter: Secondary | ICD-10-CM

## 2021-11-21 DIAGNOSIS — K5900 Constipation, unspecified: Secondary | ICD-10-CM | POA: Diagnosis present

## 2021-11-21 LAB — COMPREHENSIVE METABOLIC PANEL
ALT: 12 U/L (ref 0–44)
AST: 34 U/L (ref 15–41)
Albumin: 4.2 g/dL (ref 3.5–5.0)
Alkaline Phosphatase: 52 U/L (ref 38–126)
Anion gap: 17 — ABNORMAL HIGH (ref 5–15)
BUN: 15 mg/dL (ref 6–20)
CO2: 18 mmol/L — ABNORMAL LOW (ref 22–32)
Calcium: 9.3 mg/dL (ref 8.9–10.3)
Chloride: 103 mmol/L (ref 98–111)
Creatinine, Ser: 1.32 mg/dL — ABNORMAL HIGH (ref 0.61–1.24)
GFR, Estimated: >60 mL/min (ref >60)
Glucose, Bld: 107 mg/dL — ABNORMAL HIGH (ref 70–99)
Potassium: 4 mmol/L (ref 3.5–5.1)
Sodium: 138 mmol/L (ref 135–145)
Total Bilirubin: 0.9 mg/dL (ref 0.3–1.2)
Total Protein: 6.6 g/dL (ref 6.5–8.1)

## 2021-11-21 LAB — CBC
HCT: 46.4 % (ref 39.0–52.0)
Hemoglobin: 14.8 g/dL (ref 13.0–17.0)
MCH: 26.6 pg (ref 26.0–34.0)
MCHC: 31.9 g/dL (ref 30.0–36.0)
MCV: 83.3 fL (ref 80.0–100.0)
Platelets: 166 10*3/uL (ref 150–400)
RBC: 5.57 MIL/uL (ref 4.22–5.81)
RDW: 15.6 % — ABNORMAL HIGH (ref 11.5–15.5)
WBC: 7.2 10*3/uL (ref 4.0–10.5)
nRBC: 0 % (ref 0.0–0.2)

## 2021-11-21 LAB — RESP PANEL BY RT-PCR (FLU A&B, COVID) ARPGX2
Influenza A by PCR: NEGATIVE
Influenza B by PCR: NEGATIVE
SARS Coronavirus 2 by RT PCR: NEGATIVE

## 2021-11-21 LAB — I-STAT CHEM 8, ED
BUN: 17 mg/dL (ref 6–20)
Calcium, Ion: 1.04 mmol/L — ABNORMAL LOW (ref 1.15–1.40)
Chloride: 103 mmol/L (ref 98–111)
Creatinine, Ser: 1.3 mg/dL — ABNORMAL HIGH (ref 0.61–1.24)
Glucose, Bld: 102 mg/dL — ABNORMAL HIGH (ref 70–99)
HCT: 48 % (ref 39.0–52.0)
Hemoglobin: 16.3 g/dL (ref 13.0–17.0)
Potassium: 4 mmol/L (ref 3.5–5.1)
Sodium: 137 mmol/L (ref 135–145)
TCO2: 20 mmol/L — ABNORMAL LOW (ref 22–32)

## 2021-11-21 LAB — LACTIC ACID, PLASMA
Lactic Acid, Venous: >9 mmol/L (ref 0.5–1.9)
Lactic Acid, Venous: 3.7 mmol/L (ref 0.5–1.9)
Lactic Acid, Venous: 2.3 mmol/L (ref 0.5–1.9)

## 2021-11-21 LAB — URINALYSIS, ROUTINE W REFLEX MICROSCOPIC
Bilirubin Urine: NEGATIVE
Glucose, UA: NEGATIVE mg/dL
Ketones, ur: 20 mg/dL — AB
Leukocytes,Ua: NEGATIVE
Nitrite: NEGATIVE
Protein, ur: 30 mg/dL — AB
Specific Gravity, Urine: 1.027 (ref 1.005–1.030)
pH: 5 (ref 5.0–8.0)

## 2021-11-21 LAB — PROTIME-INR
INR: 1.2 (ref 0.8–1.2)
Prothrombin Time: 14.9 seconds (ref 11.4–15.2)

## 2021-11-21 LAB — SAMPLE TO BLOOD BANK

## 2021-11-21 LAB — ETHANOL: Alcohol, Ethyl (B): 96 mg/dL — ABNORMAL HIGH (ref <10)

## 2021-11-21 MED ORDER — ENOXAPARIN SODIUM 30 MG/0.3ML IJ SOSY
30.0000 mg | PREFILLED_SYRINGE | Freq: Two times a day (BID) | INTRAMUSCULAR | Status: DC
  Administered 2021-11-22 – 2021-11-24 (×5): 30 mg via SUBCUTANEOUS
  Filled 2021-11-21 (×5): qty 0.3

## 2021-11-21 MED ORDER — OXYCODONE HCL 5 MG PO TABS
10.0000 mg | ORAL_TABLET | ORAL | Status: DC | PRN
  Administered 2021-11-22 – 2021-11-24 (×5): 10 mg via ORAL
  Filled 2021-11-21 (×6): qty 2

## 2021-11-21 MED ORDER — ACETAMINOPHEN 325 MG PO TABS
650.0000 mg | ORAL_TABLET | Freq: Four times a day (QID) | ORAL | Status: DC
  Administered 2021-11-21 – 2021-11-23 (×7): 650 mg via ORAL
  Filled 2021-11-21 (×7): qty 2

## 2021-11-21 MED ORDER — OXYCODONE HCL 5 MG PO TABS
5.0000 mg | ORAL_TABLET | ORAL | Status: DC | PRN
  Administered 2021-11-22 – 2021-11-23 (×2): 5 mg via ORAL
  Filled 2021-11-21 (×2): qty 1

## 2021-11-21 MED ORDER — ONDANSETRON 4 MG PO TBDP: 4.0000 mg | ORAL_TABLET | Freq: Four times a day (QID) | ORAL | Status: DC | PRN

## 2021-11-21 MED ORDER — SODIUM CHLORIDE 0.9 % IV BOLUS
1000.0000 mL | Freq: Once | INTRAVENOUS | Status: AC
  Administered 2021-11-21: 1000 mL via INTRAVENOUS

## 2021-11-21 MED ORDER — LACTATED RINGERS IV SOLN: INTRAVENOUS | Status: DC

## 2021-11-21 MED ORDER — TETANUS-DIPHTH-ACELL PERTUSSIS 5-2.5-18.5 LF-MCG/0.5 IM SUSY
0.5000 mL | PREFILLED_SYRINGE | Freq: Once | INTRAMUSCULAR | Status: AC
Start: 2021-11-21 — End: 2021-11-21
  Administered 2021-11-21: 0.5 mL via INTRAMUSCULAR
  Filled 2021-11-21: qty 0.5

## 2021-11-21 MED ORDER — MORPHINE SULFATE (PF) 4 MG/ML IV SOLN
4.0000 mg | INTRAVENOUS | Status: DC | PRN
  Administered 2021-11-21: 4 mg via INTRAVENOUS
  Filled 2021-11-21: qty 1

## 2021-11-21 MED ORDER — ONDANSETRON HCL 4 MG/2ML IJ SOLN
4.0000 mg | Freq: Four times a day (QID) | INTRAMUSCULAR | Status: DC | PRN
  Administered 2021-11-24: 4 mg via INTRAVENOUS
  Filled 2021-11-21: qty 2

## 2021-11-21 MED ORDER — METHOCARBAMOL 750 MG PO TABS: 750.0000 mg | ORAL_TABLET | Freq: Three times a day (TID) | ORAL | Status: DC | PRN

## 2021-11-21 NOTE — ED Notes (Signed)
Pt ambulated to restroom with assistance. Pt unsteady on feet. Encouraged to take it slow. Linen changed. Pt given Ice pack for facial swelling.
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
## 2021-11-21 NOTE — Progress Notes (Signed)
Transition of Care [facility]) - CAGE-AID Screening   Patient Details  Name: Nathan Harmon MRN: [id number] Date of Birth: 09-Apr-1991     Hewitt Shorts, RN Trauma response Nurse Phone Number: [phone] 11/21/2021, 1:18 PM   CAGE-AID Screening:               Substance Abuse Education Offered: No (drinks socially, no drugs)

## 2021-11-21 NOTE — ED Notes (Signed)
Gave PO fluids and patient falls right back to sleep.  Got patient up to use urinal and patient almost fell.  ASsisted patient back to bed and advised to not get out of bed without assistance.  Ice pack applied back to left side of face.

## 2021-11-21 NOTE — ED Notes (Signed)
Pt in room A&O x4 with facial swelling. Updated on pane of care. Pt requesting pain medication. Educated pt that at this time I have no pain med order but will be requesting it. Pt receptive to information given. Room adjusted for comfort. Pt using urinal and able to shift self in bed.
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
## 2021-11-22 ENCOUNTER — Other Ambulatory Visit (HOSPITAL_COMMUNITY): Payer: Self-pay

## 2021-11-22 LAB — BASIC METABOLIC PANEL
Anion gap: 12 (ref 5–15)
BUN: 10 mg/dL (ref 6–20)
CO2: 25 mmol/L (ref 22–32)
Calcium: 9.7 mg/dL (ref 8.9–10.3)
Chloride: 105 mmol/L (ref 98–111)
Creatinine, Ser: 1.02 mg/dL (ref 0.61–1.24)
GFR, Estimated: >60 mL/min (ref >60)
Glucose, Bld: 81 mg/dL (ref 70–99)
Potassium: 3.5 mmol/L (ref 3.5–5.1)
Sodium: 142 mmol/L (ref 135–145)

## 2021-11-22 LAB — CBC
HCT: 41.3 % (ref 39.0–52.0)
Hemoglobin: 13.6 g/dL (ref 13.0–17.0)
MCH: 26.4 pg (ref 26.0–34.0)
MCHC: 32.9 g/dL (ref 30.0–36.0)
MCV: 80.2 fL (ref 80.0–100.0)
Platelets: 161 10*3/uL (ref 150–400)
RBC: 5.15 MIL/uL (ref 4.22–5.81)
RDW: 14.9 % (ref 11.5–15.5)
WBC: 21.2 10*3/uL — ABNORMAL HIGH (ref 4.0–10.5)
nRBC: 0 % (ref 0.0–0.2)

## 2021-11-22 LAB — HIV ANTIBODY (ROUTINE TESTING W REFLEX): HIV Screen 4th Generation wRfx: NONREACTIVE

## 2021-11-22 MED ORDER — MORPHINE SULFATE (PF) 4 MG/ML IV SOLN
4.0000 mg | Freq: Four times a day (QID) | INTRAVENOUS | Status: DC | PRN
  Administered 2021-11-23 (×2): 4 mg via INTRAVENOUS
  Filled 2021-11-22 (×2): qty 1

## 2021-11-22 MED ORDER — POLYETHYLENE GLYCOL 3350 17 G PO PACK
17.0000 g | PACK | Freq: Every day | ORAL | Status: DC | PRN
  Administered 2021-11-23: 17 g via ORAL
  Filled 2021-11-22: qty 1

## 2021-11-22 MED ORDER — OXYCODONE HCL 5 MG PO TABS
5.0000 mg | ORAL_TABLET | Freq: Four times a day (QID) | ORAL | 0 refills | Status: AC | PRN
  Filled 2021-11-22 – 2021-11-23 (×2): qty 15, 4d supply, fill #0

## 2021-11-22 MED ORDER — ACETAMINOPHEN 325 MG PO TABS: 650.0000 mg | ORAL_TABLET | Freq: Four times a day (QID) | ORAL | Status: AC | PRN

## 2021-11-22 MED ORDER — POLYETHYLENE GLYCOL 3350 17 G PO PACK: 17.0000 g | PACK | Freq: Every day | ORAL | 0 refills | Status: AC | PRN

## 2021-11-22 NOTE — Plan of Care (Signed)
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
## 2021-11-23 ENCOUNTER — Other Ambulatory Visit (HOSPITAL_COMMUNITY): Payer: Self-pay

## 2021-11-23 MED ORDER — METHOCARBAMOL 750 MG PO TABS
750.0000 mg | ORAL_TABLET | Freq: Three times a day (TID) | ORAL | Status: DC
  Administered 2021-11-23 – 2021-11-24 (×4): 750 mg via ORAL
  Filled 2021-11-23 (×4): qty 1

## 2021-11-23 MED ORDER — ACETAMINOPHEN 500 MG PO TABS
1000.0000 mg | ORAL_TABLET | Freq: Four times a day (QID) | ORAL | Status: DC
  Administered 2021-11-23 – 2021-11-24 (×4): 1000 mg via ORAL
  Filled 2021-11-23 (×4): qty 2

## 2021-11-23 MED ORDER — METHOCARBAMOL 750 MG PO TABS
750.0000 mg | ORAL_TABLET | Freq: Three times a day (TID) | ORAL | 0 refills | Status: AC | PRN
  Filled 2021-11-23: qty 60, 20d supply, fill #0

## 2021-11-23 NOTE — Progress Notes (Signed)
Pt was assisted to the shower and was able to walk with one asst.  Pt is very weak but is willing to do everything on his own.  Bed linens changed and pain meds given.  Call bell in reach.  Enc pt to call for asst when the need arises.
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
## 2021-11-23 NOTE — Plan of Care (Signed)
  Problem: Clinical Measurements: Goal: Will remain free from infection Outcome: Progressing   Problem: Clinical Measurements: Goal: Diagnostic test results will improve Outcome: Progressing   Problem: Nutrition: Goal: Adequate nutrition will be maintained Outcome: Progressing   Problem: Coping: Goal: Level of anxiety will decrease Outcome: Progressing   
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
## 2021-11-24 NOTE — Plan of Care (Signed)

## 2021-11-24 NOTE — Progress Notes (Signed)
Discharge instruction given to family member, med and CD given. Instructed patient to follow up care in [place].

## 2021-11-24 NOTE — Progress Notes (Signed)
Plastic Surgery HD#4  Temp:  [98.4 F (36.9 C)-98.7 F (37.1 C)] 98.7 F (37.1 C) (07/08 0756) Pulse Rate:  [49-80] 49 (07/08 0756) Resp:  [16-17] 16 (07/08 0756) BP: (117-143)/(67-99) 143/99 (07/08 0756) SpO2:  [99 %-100 %] 99 % (07/08 0756)   Gen alert oriented HEENT: decreased edema, bilateral conjunctival hemorrhage noted some restriction movement left likely due to edema, improved movement compared with last exam no evidence entrapment  Reports double vision today when looking up Remainder CN exam grossly intact  A/P Did not discharge yesterday. Anticipate d/c today.   Patient again asking if surgery necessary. Reviewed given size of defect and today reports some diplopia, likely will benefit from plate fixation.   Has scheduled follow up in office Monday 7.10.13 at 215 pm. If he is still in hospital on that date will see him here.   Glenna Fellows, MD [facility]  Office/ physician access line after hours [phone]

## 2021-11-24 NOTE — Progress Notes (Signed)
Upon discharging the patient, staff said patient somewhat slow to respond, eyes closed. VSS (see chart) taken. MD called and updated about the episode. Per MD ok to discharge patient.

## 2022-11-04 IMAGING — DX DG CHEST 1V PORT
1 series · 1 of 1 positions shown · non-contrast
Comparison: 10/07/2021

CLINICAL DATA: Shortness of breath, cough

EXAM:
PORTABLE CHEST 1 VIEW

[chest ap]
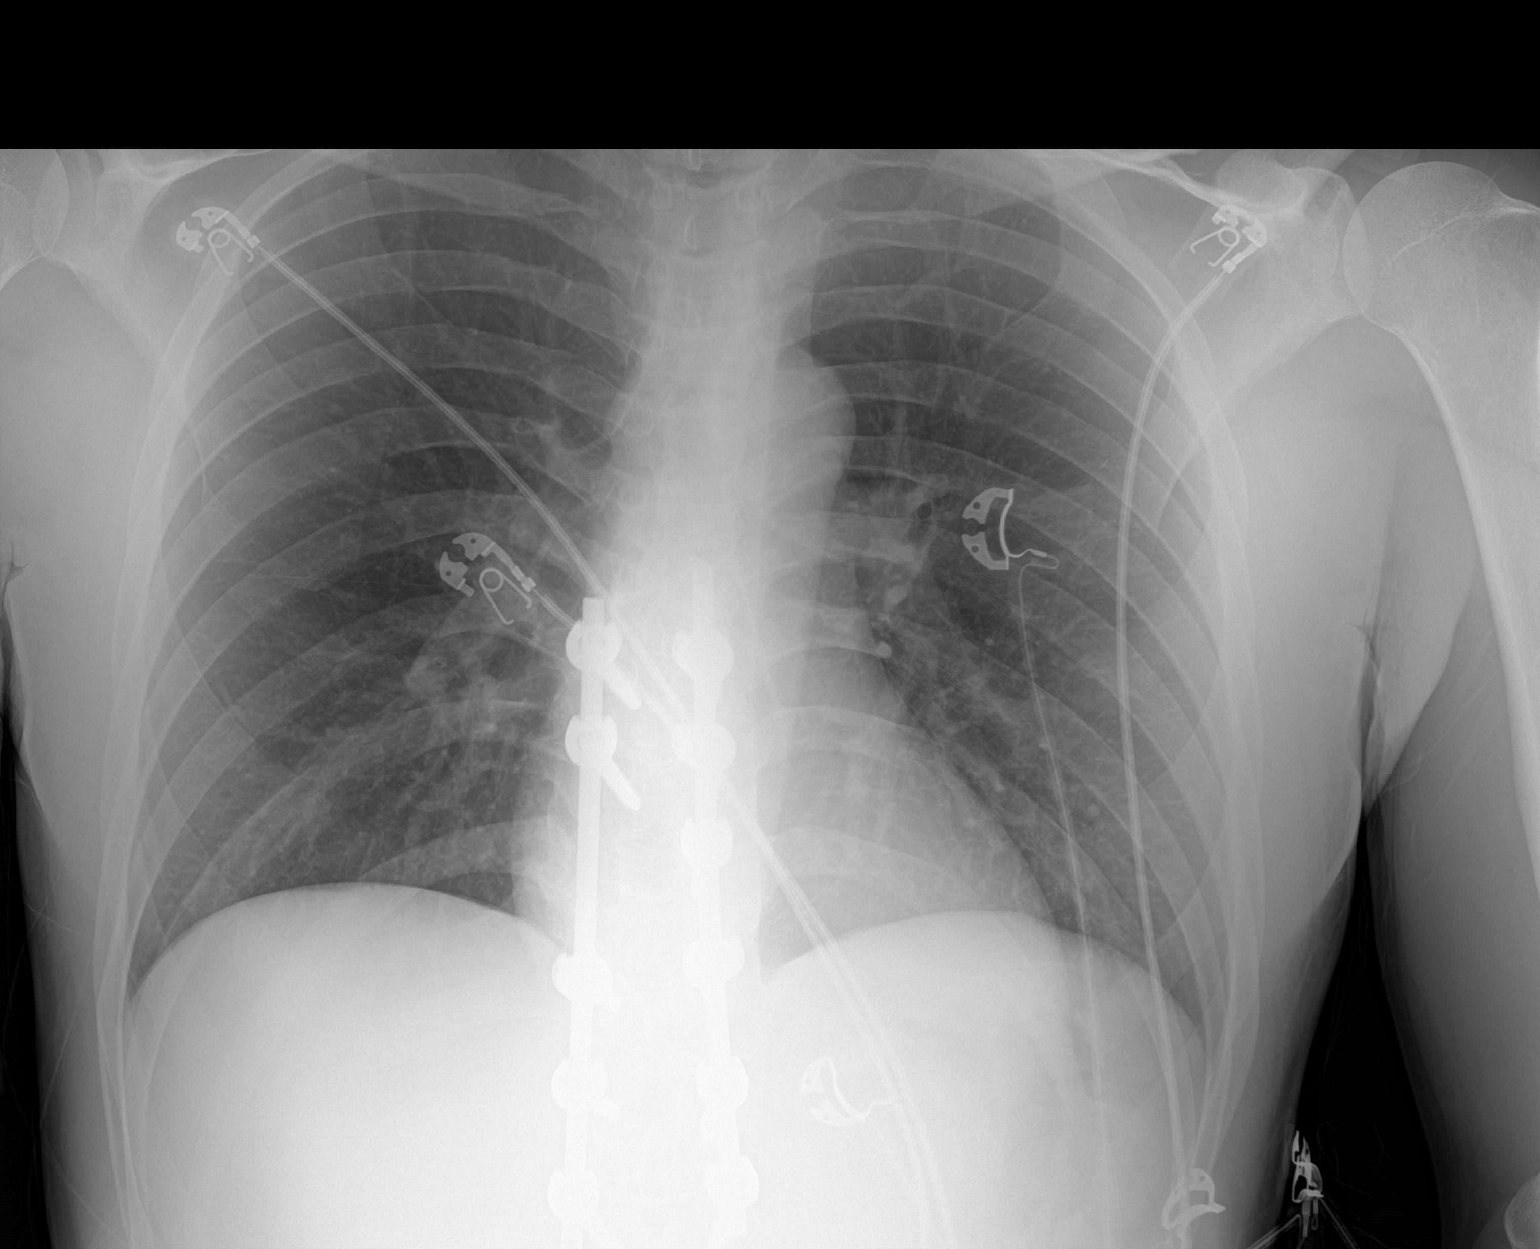

[1 of 1 positions shown; findings below may reference images not displayed]

FINDINGS: Cardiac size is within normal limits. Lung fields are clear of any
infiltrates or pulmonary edema. There is no pleural effusion or
pneumothorax. There is previous surgical fusion in the thoracic and
lumbar spine. No significant interval changes are noted in the lung
fields.
IMPRESSION: No active disease.
